# Patient Record
Sex: Female | Born: 1965 | Race: White | Hispanic: No | Marital: Married
Health system: Southern US, Community
[De-identification: ages and names within clinical notes are randomized; demographics above are authoritative.]

## PROBLEM LIST (undated history)

## (undated) DIAGNOSIS — N200 Calculus of kidney: Secondary | ICD-10-CM

## (undated) HISTORY — PX: SHOULDER SURGERY: SHX246

## (undated) HISTORY — PX: CHOLECYSTECTOMY: SHX55

---

## 2017-12-25 ENCOUNTER — Encounter (HOSPITAL_COMMUNITY): Payer: Self-pay | Admitting: Emergency Medicine

## 2017-12-25 ENCOUNTER — Emergency Department (HOSPITAL_COMMUNITY): Payer: Managed Care, Other (non HMO)

## 2017-12-25 ENCOUNTER — Other Ambulatory Visit: Payer: Self-pay

## 2017-12-25 ENCOUNTER — Emergency Department (HOSPITAL_COMMUNITY)
Admission: EM | Admit: 2017-12-25 | Discharge: 2017-12-25 | Disposition: A | Discharge status: Home / Self Care | Payer: Managed Care, Other (non HMO) | Attending: Emergency Medicine | Admitting: Emergency Medicine

## 2017-12-25 DIAGNOSIS — R0602 Shortness of breath: Secondary | ICD-10-CM | POA: Diagnosis not present

## 2017-12-25 DIAGNOSIS — E860 Dehydration: Secondary | ICD-10-CM | POA: Diagnosis not present

## 2017-12-25 DIAGNOSIS — I951 Orthostatic hypotension: Secondary | ICD-10-CM

## 2017-12-25 DIAGNOSIS — R42 Dizziness and giddiness: Secondary | ICD-10-CM | POA: Diagnosis present

## 2017-12-25 HISTORY — DX: Calculus of kidney: N20.0

## 2017-12-25 LAB — CBC WITH DIFFERENTIAL/PLATELET
BASOS ABS: 0 10*3/uL (ref 0.0–0.1)
BASOS PCT: 0 %
EOS PCT: 2 %
Eosinophils Absolute: 0.2 10*3/uL (ref 0.0–0.7)
HCT: 34.7 % — ABNORMAL LOW (ref 36.0–46.0)
Hemoglobin: 10.9 g/dL — ABNORMAL LOW (ref 12.0–15.0)
Lymphocytes Relative: 31 %
Lymphs Abs: 2.6 10*3/uL (ref 0.7–4.0)
MCH: 25.1 pg — ABNORMAL LOW (ref 26.0–34.0)
MCHC: 31.4 g/dL (ref 30.0–36.0)
MCV: 79.8 fL (ref 78.0–100.0)
MONO ABS: 0.4 10*3/uL (ref 0.1–1.0)
Monocytes Relative: 5 %
Neutro Abs: 5.1 10*3/uL (ref 1.7–7.7)
Neutrophils Relative %: 62 %
Platelets: 264 10*3/uL (ref 150–400)
RBC: 4.35 MIL/uL (ref 3.87–5.11)
RDW: 15 % (ref 11.5–15.5)
WBC: 8.3 10*3/uL (ref 4.0–10.5)

## 2017-12-25 LAB — URINALYSIS, ROUTINE W REFLEX MICROSCOPIC
Bilirubin Urine: NEGATIVE
Glucose, UA: NEGATIVE mg/dL
Hgb urine dipstick: NEGATIVE
KETONES UR: NEGATIVE mg/dL
LEUKOCYTES UA: NEGATIVE
NITRITE: NEGATIVE
Protein, ur: NEGATIVE mg/dL
SPECIFIC GRAVITY, URINE: 1.01 (ref 1.005–1.030)
pH: 7 (ref 5.0–8.0)

## 2017-12-25 LAB — BASIC METABOLIC PANEL
Anion gap: 7 (ref 5–15)
BUN: 12 mg/dL (ref 6–20)
CHLORIDE: 104 mmol/L (ref 101–111)
CO2: 27 mmol/L (ref 22–32)
CREATININE: 0.82 mg/dL (ref 0.44–1.00)
Calcium: 8.9 mg/dL (ref 8.9–10.3)
GFR calc Af Amer: >60 mL/min (ref >60)
GFR calc non Af Amer: >60 mL/min (ref >60)
Glucose, Bld: 115 mg/dL — ABNORMAL HIGH (ref 65–99)
POTASSIUM: 3.4 mmol/L — AB (ref 3.5–5.1)
SODIUM: 138 mmol/L (ref 135–145)

## 2017-12-25 LAB — I-STAT TROPONIN, ED: TROPONIN I, POC: 0 ng/mL (ref 0.00–0.08)

## 2017-12-25 LAB — PREGNANCY, URINE: PREG TEST UR: NEGATIVE

## 2017-12-25 LAB — BRAIN NATRIURETIC PEPTIDE: B Natriuretic Peptide: 21.1 pg/mL (ref 0.0–100.0)

## 2017-12-25 LAB — D-DIMER, QUANTITATIVE (NOT AT ARMC)

## 2017-12-25 MED ORDER — SODIUM CHLORIDE 0.9 % IV BOLUS
1000.0000 mL | Freq: Once | INTRAVENOUS | Status: AC
  Administered 2017-12-25: 1000 mL via INTRAVENOUS

## 2017-12-25 NOTE — ED Notes (Signed)
Patient transported to CT 

## 2017-12-25 NOTE — ED Notes (Signed)
During orthostatic vitals, pt stated that she felt a little lightheaded (sitting and standing). While laying flat, pt stated that she" felt pressure on the top of her head". Informed Dr. Particia Nearing.

## 2017-12-25 NOTE — ED Provider Notes (Signed)
[facility] EMERGENCY DEPARTMENT Provider Note   CSN: [id number] Arrival date & time:        History   Chief Complaint Chief Complaint  Patient presents with  . Dizziness    HPI Brittany Luna is a 52 y.o. female.  Pt presents to the ED today with sob and feeling lightheaded.  The pt said she was driving, coughed and felt lightheaded.  It went away, then sx came back several times.  The pt said she felt like she was going to pass out and pulled over.  She has been outside working in the heat for the last few days.  She denies any pain, n/v/d or f/c.     Past Medical History:  Diagnosis Date  . Kidney stones     There are no active problems to display for this patient.   Past Surgical History:  Procedure Laterality Date  . CHOLECYSTECTOMY    . SHOULDER SURGERY       OB History   None      Home Medications    Prior to Admission medications   Not on File    Family History No family history on file.  Social History Social History   Tobacco Use  . Smoking status: Not on file  Substance Use Topics  . Alcohol use: Not on file  . Drug use: Never     Allergies   Patient has no allergy information on record.   Review of Systems Review of Systems  Respiratory: Positive for shortness of breath.   Neurological: Positive for syncope, weakness and light-headedness.  All other systems reviewed and are negative.    Physical Exam Updated Vital Signs BP (!) 144/93   Pulse 94   Temp 98.7 F (37.1 C) (Oral)   Resp 16   SpO2 97%   Physical Exam  Constitutional: She is oriented to person, place, and time. She appears well-developed and well-nourished.  HENT:  Head: Normocephalic and atraumatic.  Right Ear: External ear normal.  Left Ear: External ear normal.  Nose: Nose normal.  Mouth/Throat: Oropharynx is clear and moist.  Eyes: Pupils are equal, round, and reactive to light. Conjunctivae and EOM are normal.  Neck: Normal  range of motion. Neck supple.  Cardiovascular: Normal rate, regular rhythm, normal heart sounds and intact distal pulses.  Pulmonary/Chest: Effort normal and breath sounds normal.  Abdominal: Soft. Bowel sounds are normal.  Musculoskeletal: Normal range of motion.  Neurological: She is alert and oriented to person, place, and time.  Skin: Skin is warm and dry. Capillary refill takes less than 2 seconds.  Psychiatric: She has a normal mood and affect. Her behavior is normal. Judgment and thought content normal.  Nursing note and vitals reviewed.    ED Treatments / Results  Labs (all labs ordered are listed, but only abnormal results are displayed) Labs Reviewed  BASIC METABOLIC PANEL - Abnormal; Notable for the following components:      Result Value   Potassium 3.4 (*)    Glucose, Bld 115 (*)    All other components within normal limits  CBC WITH DIFFERENTIAL/PLATELET - Abnormal; Notable for the following components:   Hemoglobin 10.9 (*)    HCT 34.7 (*)    MCH 25.1 (*)    All other components within normal limits  BRAIN NATRIURETIC PEPTIDE  URINALYSIS, ROUTINE W REFLEX MICROSCOPIC  PREGNANCY, URINE  D-DIMER, QUANTITATIVE (NOT AT [facility])  CBC WITH DIFFERENTIAL/PLATELET  I-STAT TROPONIN, ED    EKG  EKG Interpretation  Date/Time:  Sunday Dec 25 2017 12:59:15 EDT Ventricular Rate:  84 PR Interval:    QRS Duration: 106 QT Interval:  367 QTC Calculation: 434 R Axis:   -118 Text Interpretation:  Sinus rhythm Right superior axis Low voltage, precordial leads Borderline T abnormalities, anterior leads No old tracing to compare Confirmed by Jacalyn Lefevre [phone]) on 12/25/2017 2:21:12 PM   Radiology Dg Chest 2 View  Result Date: 12/25/2017 CLINICAL DATA:  Cough EXAM: CHEST - 2 VIEW COMPARISON:  None. FINDINGS: The heart size and mediastinal contours are within normal limits. Both lungs are clear. The visualized skeletal structures are unremarkable. IMPRESSION: No active  cardiopulmonary disease. Electronically Signed   By: Sherian Rein M.D.   On: 12/25/2017 11:55   Ct Head Wo Contrast  Result Date: 12/25/2017 CLINICAL DATA:  Head pressure and dizziness after coughing EXAM: CT HEAD WITHOUT CONTRAST TECHNIQUE: Contiguous axial images were obtained from the base of the skull through the vertex without intravenous contrast. COMPARISON:  None. FINDINGS: Brain: No evidence of acute infarction, hemorrhage, hydrocephalus, extra-axial collection or mass lesion/mass effect. Vascular: No hyperdense vessel or unexpected calcification. Skull: Intact. Sinuses/Orbits: Partial visualization of a mucous retention cyst or polyp right maxillary sinus. Otherwise negative. Other: None. IMPRESSION: No acute abnormality finding to explain the patient's symptoms. Partial visualization a mucous retention cyst or polyp in the right maxillary sinus. Electronically Signed   By: Drusilla Kanner M.D.   On: 12/25/2017 12:41    Procedures Procedures (including critical care time)  Medications Ordered in ED Medications  sodium chloride 0.9 % bolus 1,000 mL (1,000 mLs Intravenous New Bag/Given 12/25/17 1325)     Initial Impression / Assessment and Plan / ED Course  I have reviewed the triage vital signs and the nursing notes.  Pertinent labs & imaging results that were available during my care of the patient were reviewed by me and considered in my medical decision making (see chart for details).     Pt is feeling much better after IVFs.  She is encouraged to drink lots of fluids and return if worse.  Final Clinical Impressions(s) / ED Diagnoses   Final diagnoses:  Dehydration  Orthostatic hypotension    ED Discharge Orders    None       Jacalyn Lefevre, MD 12/25/17 1540

## 2017-12-25 NOTE — ED Triage Notes (Addendum)
Pt to ED via [facility] from home after having multiple episodes of "head pressure and dizziness" after coughing- denies forceful coughing - has been treated for bronchitis a month ago. No resp distress, no weakness, equal grips,

## 2019-10-16 IMAGING — DX DG CHEST 2V
2 series · 2 of 2 positions shown · non-contrast
Comparison: None.

CLINICAL DATA: Cough

EXAM:
CHEST - 2 VIEW

[w chest pa]
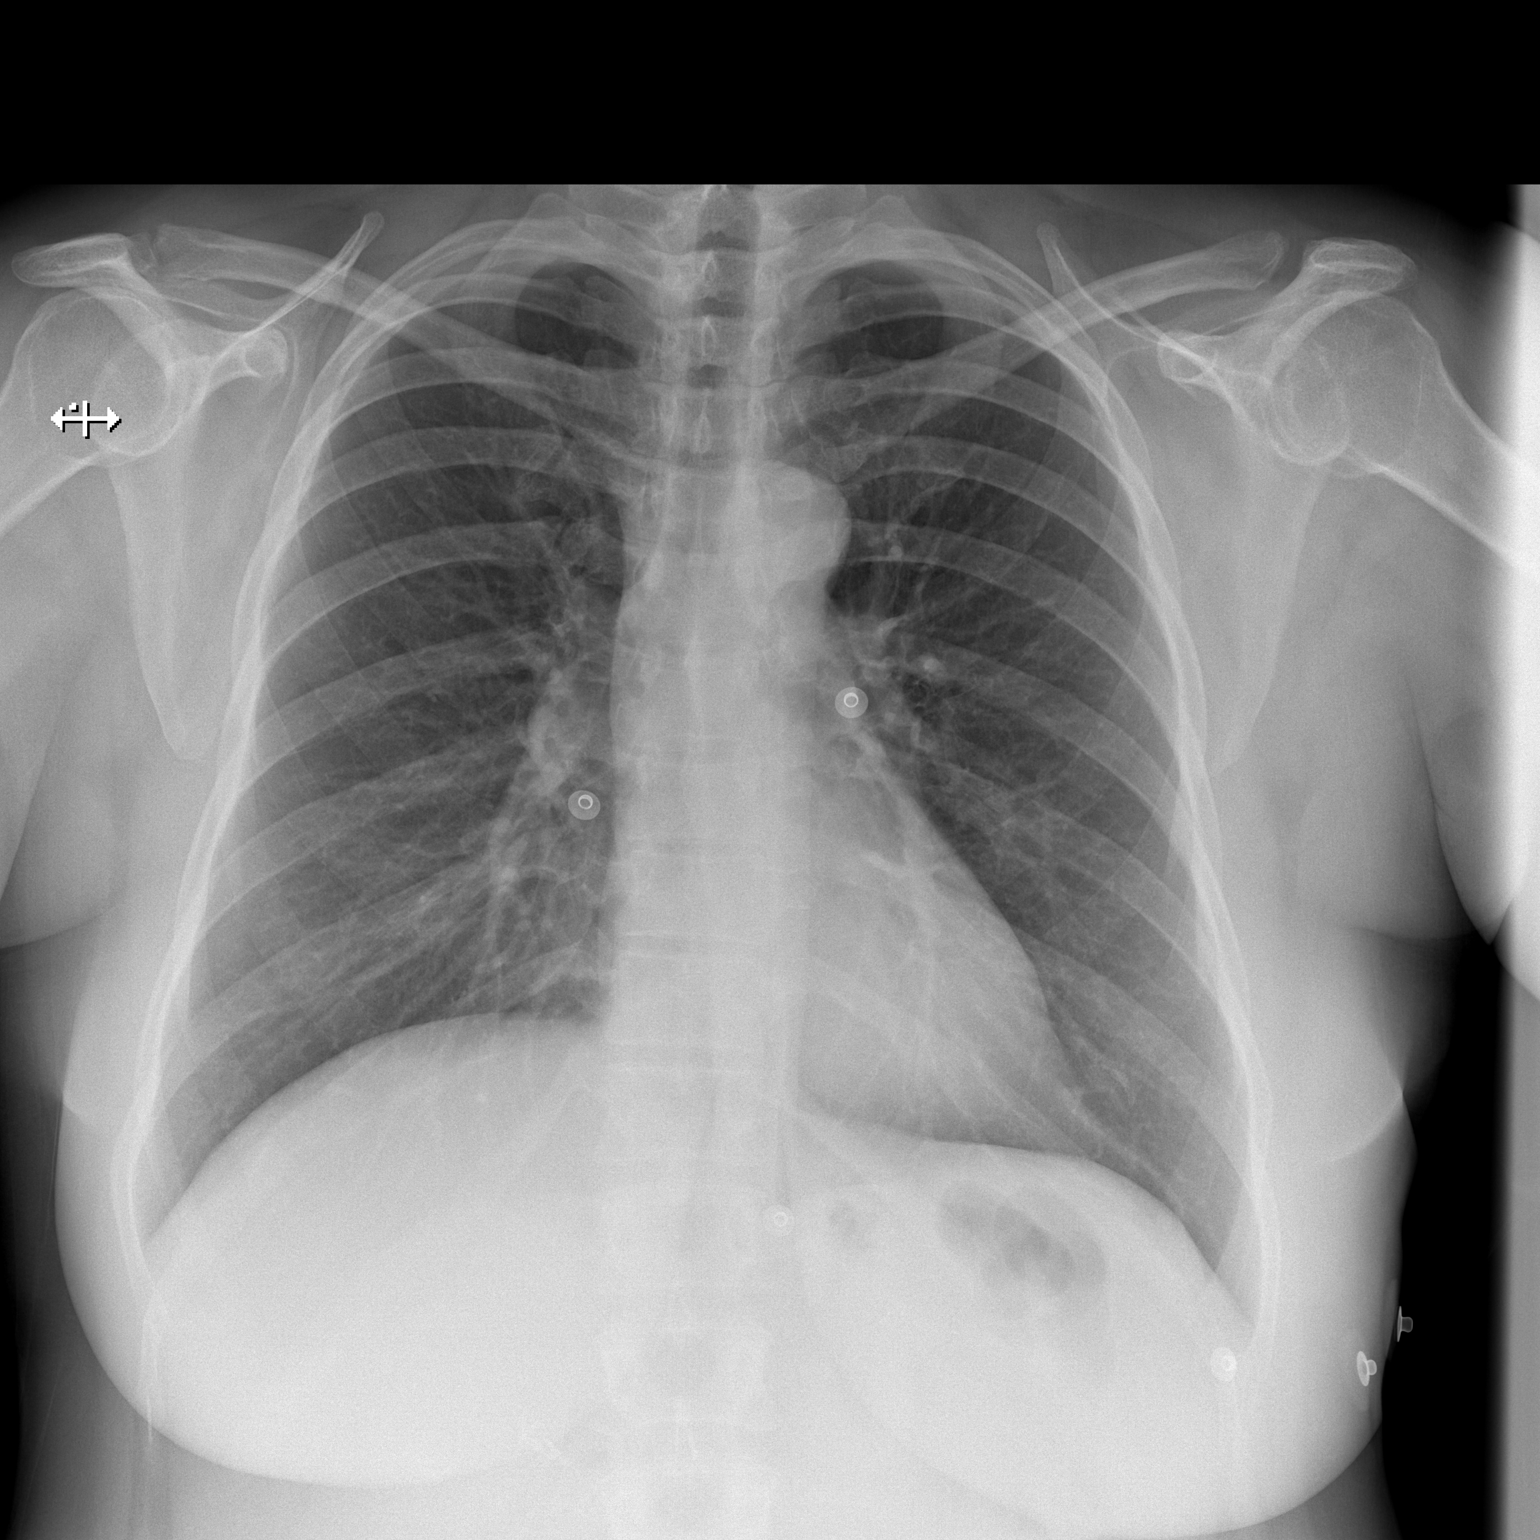

[w chest lat]
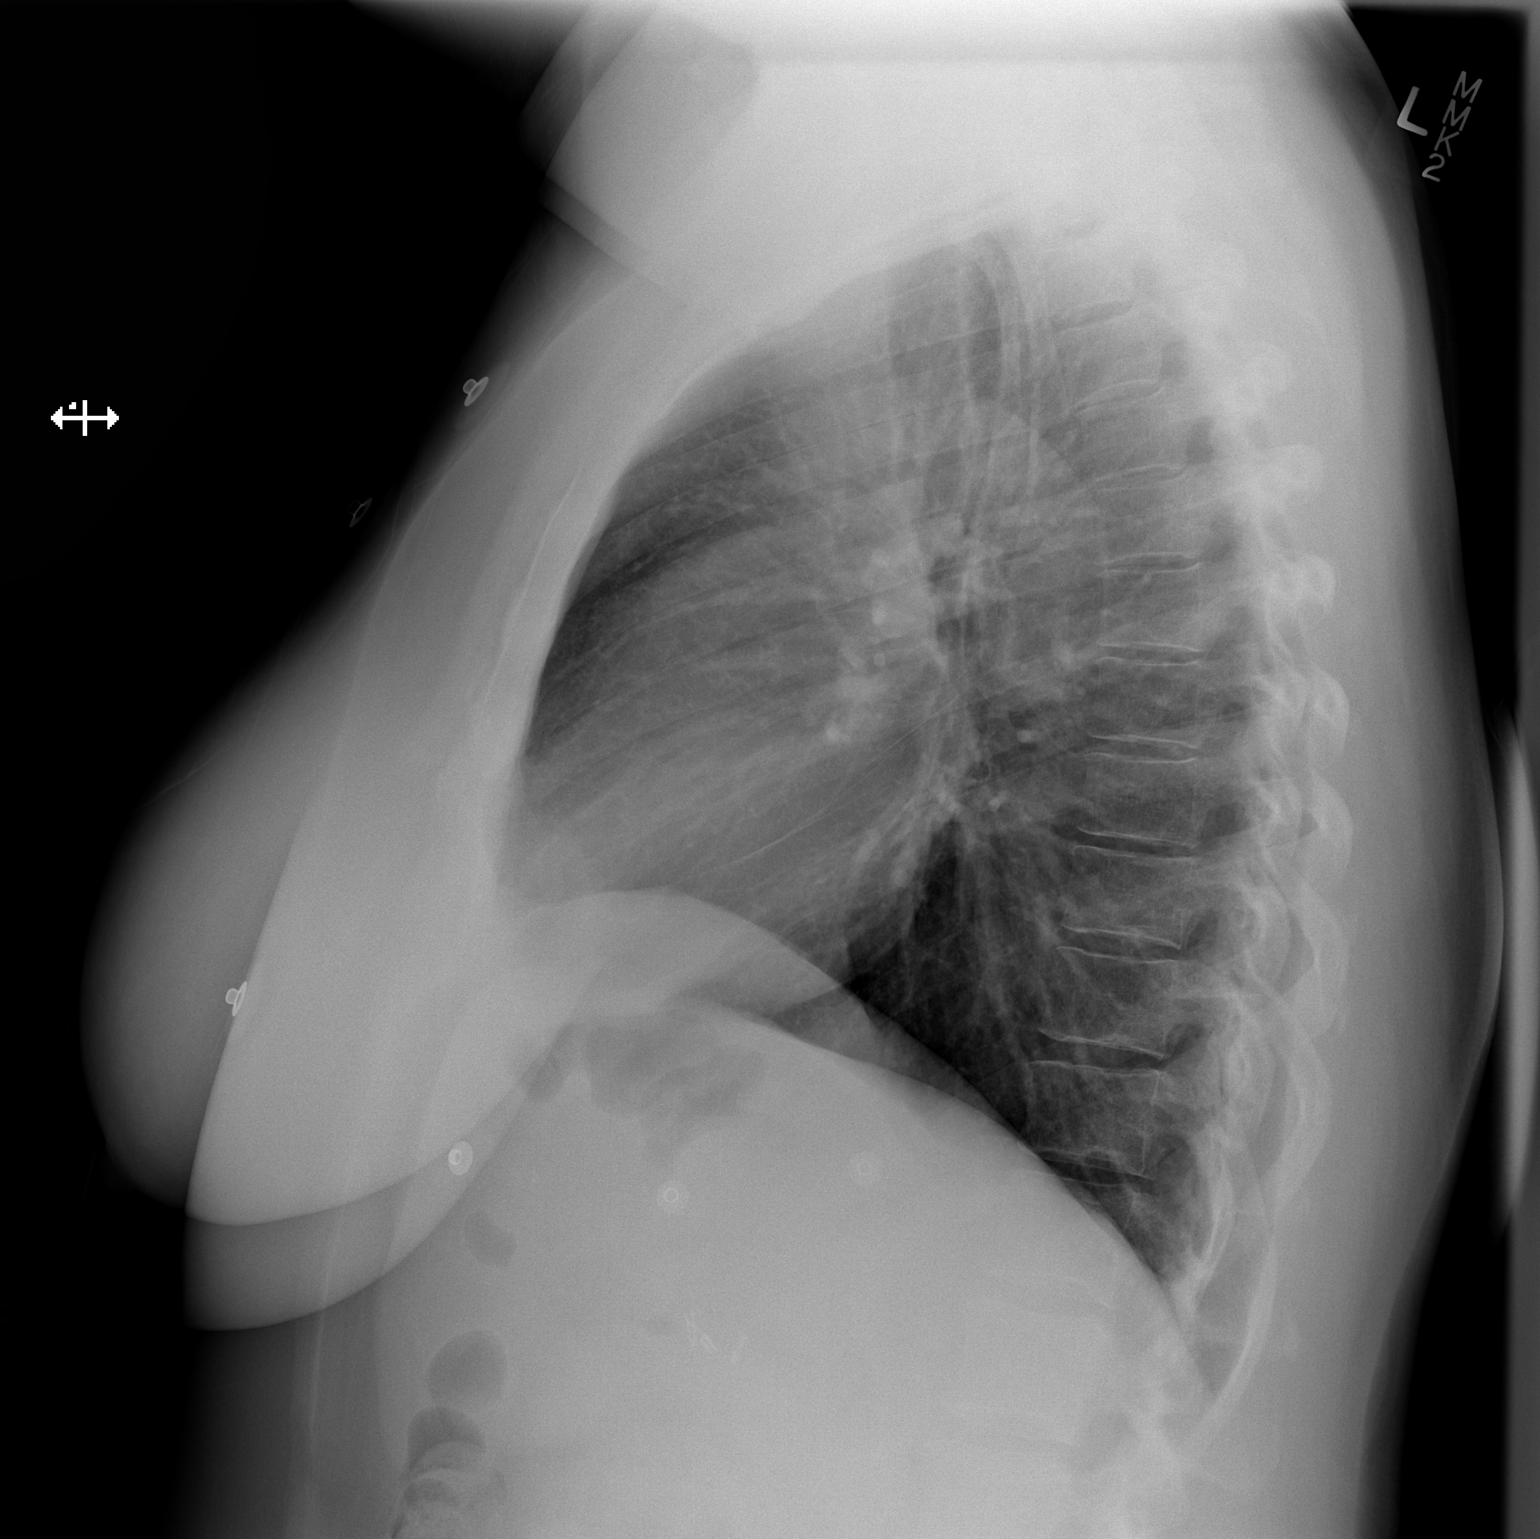

[2 of 2 positions shown; findings below may reference images not displayed]

FINDINGS: The heart size and mediastinal contours are within normal limits.
Both lungs are clear. The visualized skeletal structures are
unremarkable.
IMPRESSION: No active cardiopulmonary disease.

## 2019-10-16 IMAGING — CT CT HEAD W/O CM
4 series · 16 of 47 positions shown, 18 images · non-contrast
Comparison: None.

CLINICAL DATA: Head pressure and dizziness after coughing

EXAM:
CT HEAD WITHOUT CONTRAST
TECHNIQUE: Contiguous axial images were obtained from the base of the skull
through the vertex without intravenous contrast.

[Series 3: head wo · axial · 0.48mm/px · z∈[-110,+10]mm · 7 of 32 slices shown, 9 images]
[im 4/32  brain]
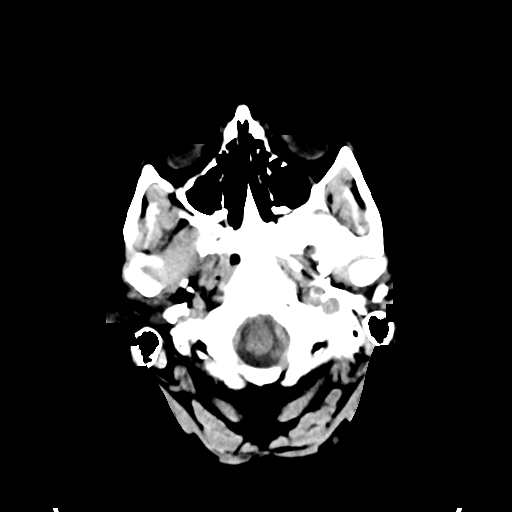
[im 4/32  bone]
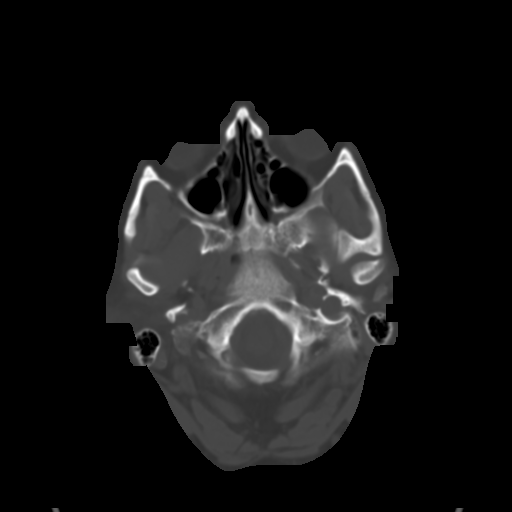
[im 8/32  brain]
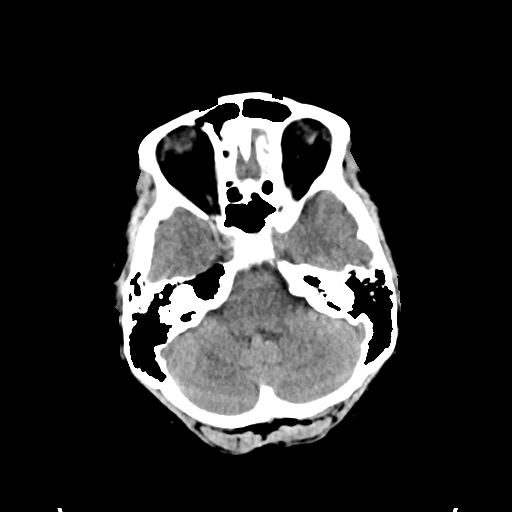
[im 12/32  brain]
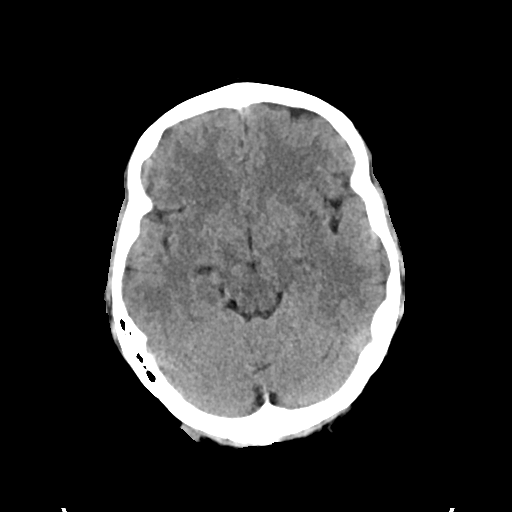
[im 16/32  brain]
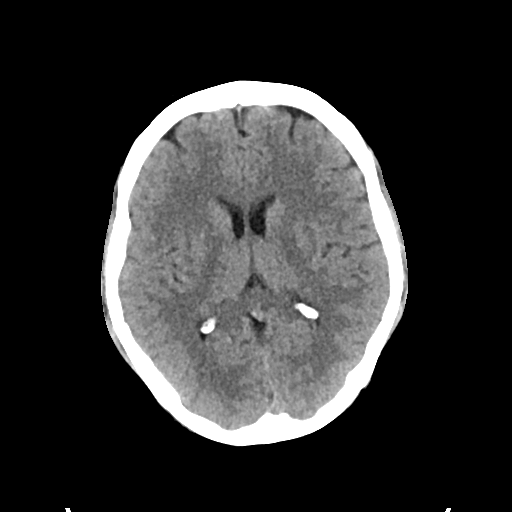
[im 20/32  brain]
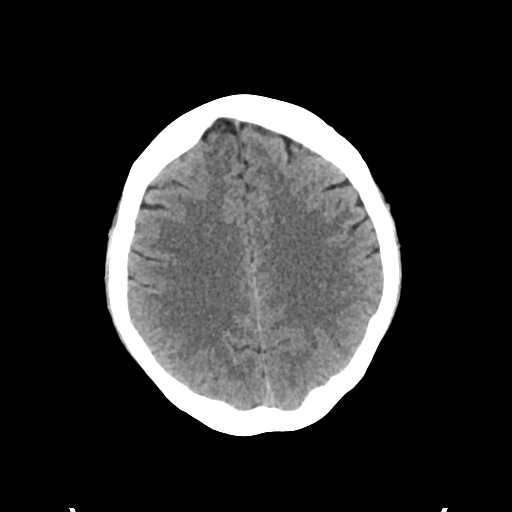
[im 20/32  bone]
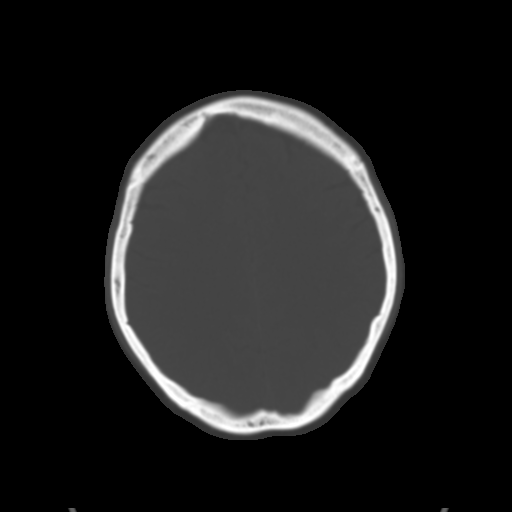
[im 24/32  brain]
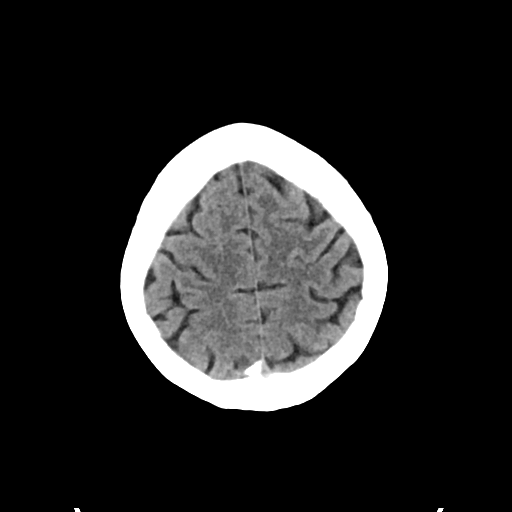
[im 28/32  brain]
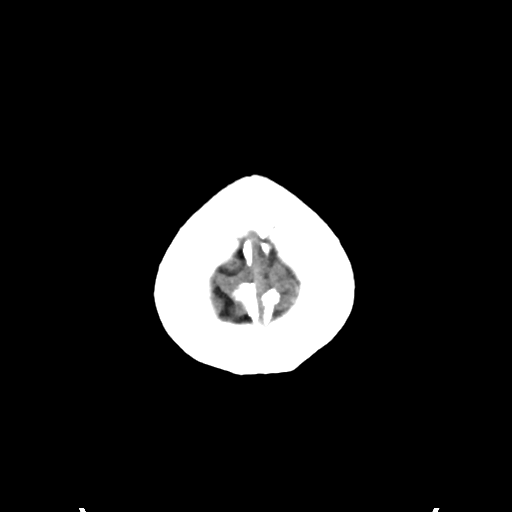

[Series 4: head bone · axial · 0.48mm/px · z∈[-110,-78]mm · 3 of 79 slices shown]
[im 8/79  bone]
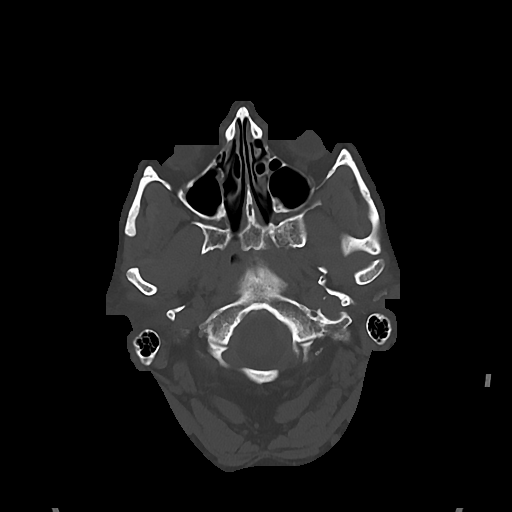
[im 16/79  bone]
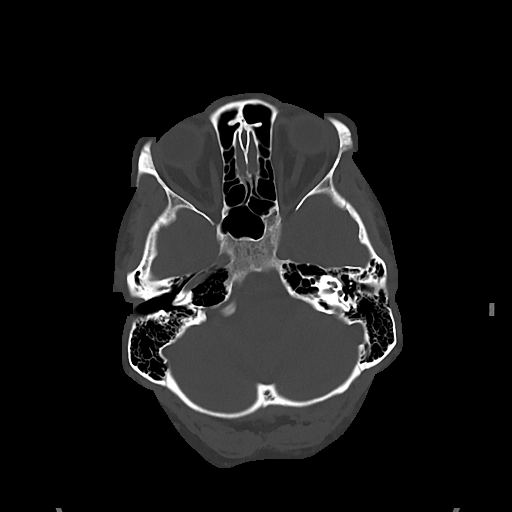
[im 24/79  bone]
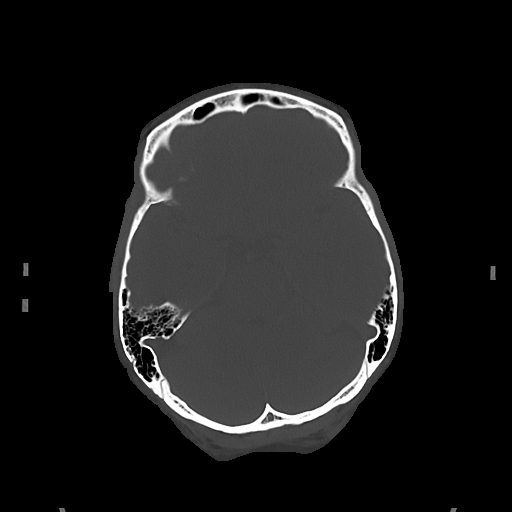

[Series 5: cor soft · coronal · 0.31mm/px · 3 of 69 slices shown]
[im 23/69  brain]
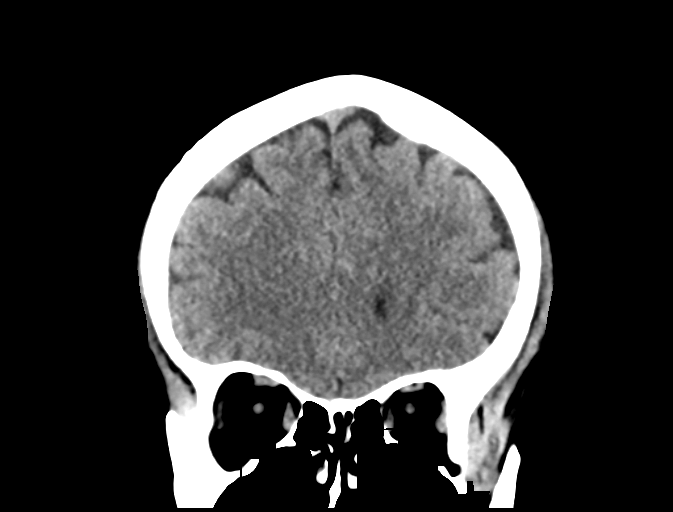
[im 31/69  brain]
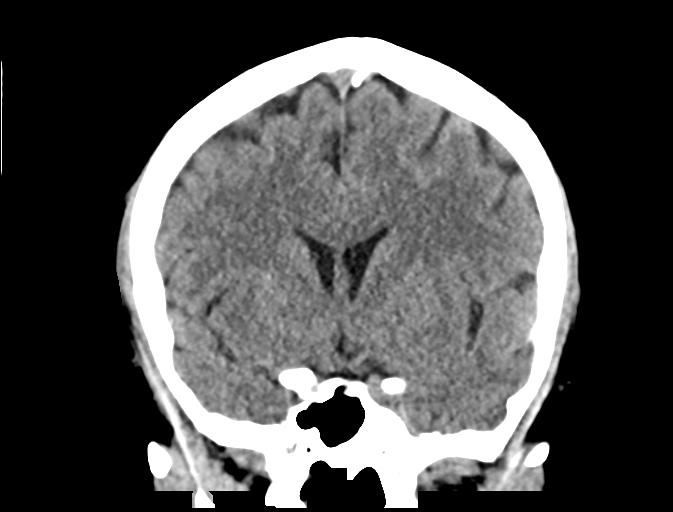
[im 38/69  brain]
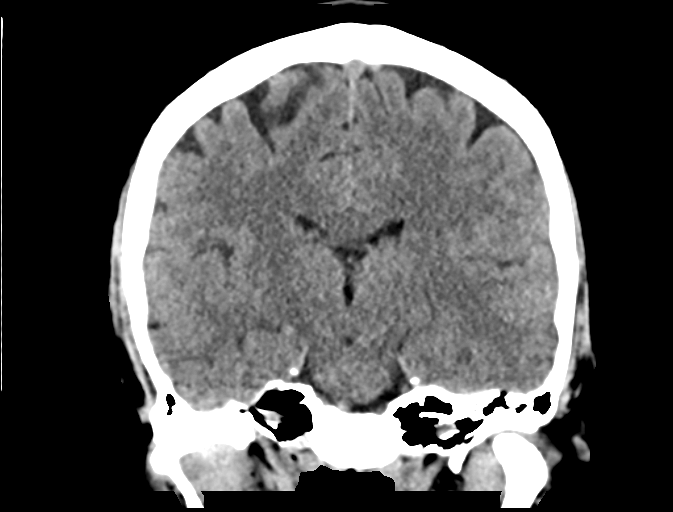

[Series 6: sag soft · sagittal · 0.35mm/px · 3 of 65 slices shown]
[im 22/65  brain]
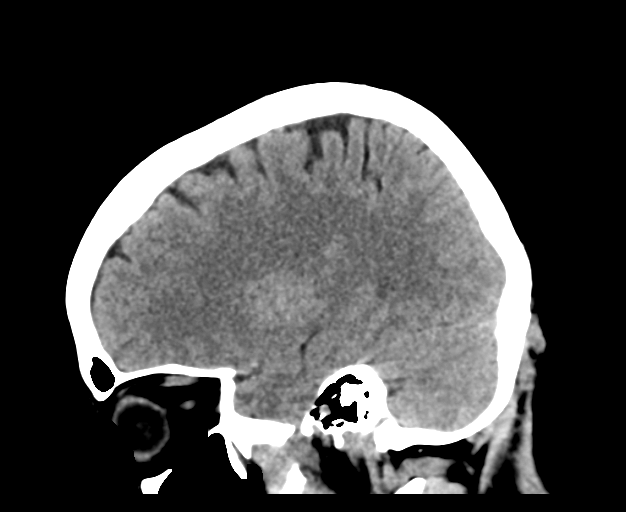
[im 33/65  brain]
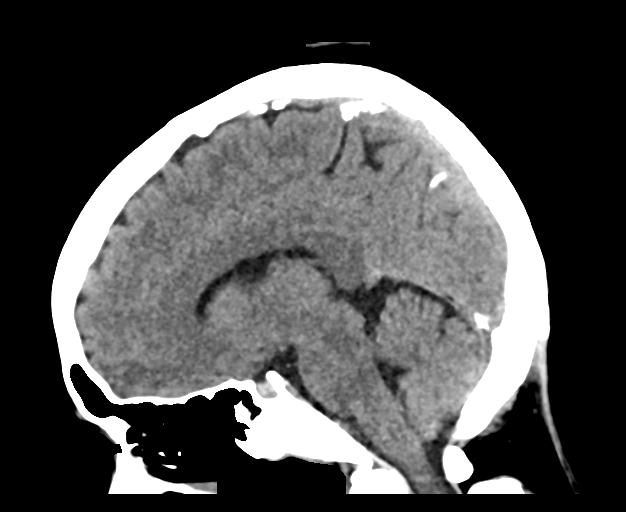
[im 43/65  brain]
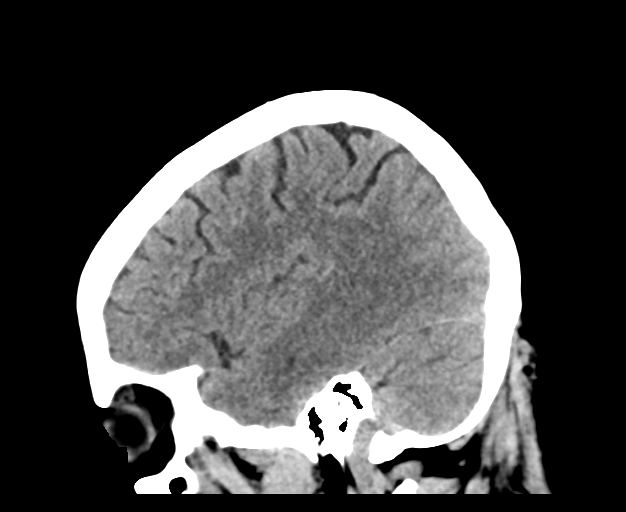

[16 of 47 positions shown; findings below may reference images not displayed]

FINDINGS: Brain: No evidence of acute infarction, hemorrhage, hydrocephalus,
extra-axial collection or mass lesion/mass effect.

Vascular: No hyperdense vessel or unexpected calcification.

Skull: Intact.

Sinuses/Orbits: Partial visualization of a mucous retention cyst or
polyp right maxillary sinus. Otherwise negative.

Other: None.
IMPRESSION: No acute abnormality finding to explain the patient's symptoms.

Partial visualization a mucous retention cyst or polyp in the right
maxillary sinus.

## 2022-06-04 ENCOUNTER — Emergency Department (HOSPITAL_BASED_OUTPATIENT_CLINIC_OR_DEPARTMENT_OTHER): Payer: Managed Care, Other (non HMO)

## 2022-06-04 ENCOUNTER — Emergency Department (HOSPITAL_BASED_OUTPATIENT_CLINIC_OR_DEPARTMENT_OTHER)
Admission: EM | Admit: 2022-06-04 | Discharge: 2022-06-04 | Disposition: A | Discharge status: Home / Self Care | Payer: Managed Care, Other (non HMO) | Attending: Emergency Medicine | Admitting: Emergency Medicine

## 2022-06-04 ENCOUNTER — Other Ambulatory Visit: Payer: Self-pay

## 2022-06-04 ENCOUNTER — Other Ambulatory Visit (HOSPITAL_BASED_OUTPATIENT_CLINIC_OR_DEPARTMENT_OTHER): Payer: Self-pay

## 2022-06-04 DIAGNOSIS — R109 Unspecified abdominal pain: Secondary | ICD-10-CM | POA: Diagnosis present

## 2022-06-04 DIAGNOSIS — N23 Unspecified renal colic: Secondary | ICD-10-CM

## 2022-06-04 DIAGNOSIS — N132 Hydronephrosis with renal and ureteral calculous obstruction: Secondary | ICD-10-CM | POA: Diagnosis not present

## 2022-06-04 LAB — BASIC METABOLIC PANEL
Anion gap: 11 (ref 5–15)
BUN: 21 mg/dL — ABNORMAL HIGH (ref 6–20)
CO2: 24 mmol/L (ref 22–32)
Calcium: 9.4 mg/dL (ref 8.9–10.3)
Chloride: 107 mmol/L (ref 98–111)
Creatinine, Ser: 1.12 mg/dL — ABNORMAL HIGH (ref 0.44–1.00)
GFR, Estimated: 58 mL/min — ABNORMAL LOW (ref >60)
Glucose, Bld: 135 mg/dL — ABNORMAL HIGH (ref 70–99)
Potassium: 3.9 mmol/L (ref 3.5–5.1)
Sodium: 142 mmol/L (ref 135–145)

## 2022-06-04 LAB — CBC
HCT: 42.3 % (ref 36.0–46.0)
Hemoglobin: 13.9 g/dL (ref 12.0–15.0)
MCH: 29 pg (ref 26.0–34.0)
MCHC: 32.9 g/dL (ref 30.0–36.0)
MCV: 88.3 fL (ref 80.0–100.0)
Platelets: 223 10*3/uL (ref 150–400)
RBC: 4.79 MIL/uL (ref 3.87–5.11)
RDW: 13 % (ref 11.5–15.5)
WBC: 11.7 10*3/uL — ABNORMAL HIGH (ref 4.0–10.5)
nRBC: 0 % (ref 0.0–0.2)

## 2022-06-04 LAB — URINALYSIS, ROUTINE W REFLEX MICROSCOPIC
Bilirubin Urine: NEGATIVE
Glucose, UA: NEGATIVE mg/dL
Ketones, ur: NEGATIVE mg/dL
Nitrite: NEGATIVE
Specific Gravity, Urine: 1.022 (ref 1.005–1.030)
pH: 5.5 (ref 5.0–8.0)

## 2022-06-04 LAB — HCG, QUANTITATIVE, PREGNANCY: hCG, Beta Chain, Quant, S: 2 m[IU]/mL (ref <5)

## 2022-06-04 LAB — PREGNANCY, URINE: Preg Test, Ur: NEGATIVE

## 2022-06-04 MED ORDER — PROMETHAZINE HCL 25 MG/ML IJ SOLN
INTRAMUSCULAR | Status: AC
  Filled 2022-06-04: qty 1

## 2022-06-04 MED ORDER — KETOROLAC TROMETHAMINE 30 MG/ML IJ SOLN
10.0000 mg | Freq: Once | INTRAMUSCULAR | Status: AC | PRN
  Administered 2022-06-04: 9.9 mg via INTRAVENOUS
  Filled 2022-06-04: qty 1

## 2022-06-04 MED ORDER — KETOROLAC TROMETHAMINE 10 MG PO TABS
10.0000 mg | ORAL_TABLET | Freq: Four times a day (QID) | ORAL | 0 refills | Status: AC | PRN
  Filled 2022-06-04: qty 20, 5d supply, fill #0

## 2022-06-04 MED ORDER — ACETAMINOPHEN 500 MG PO TABS
500.0000 mg | ORAL_TABLET | Freq: Four times a day (QID) | ORAL | 0 refills | Status: AC | PRN
  Filled 2022-06-04: qty 30, 8d supply, fill #0

## 2022-06-04 MED ORDER — ONDANSETRON 8 MG PO TBDP
8.0000 mg | ORAL_TABLET | Freq: Three times a day (TID) | ORAL | 0 refills | Status: AC | PRN
  Filled 2022-06-04: qty 20, 7d supply, fill #0

## 2022-06-04 MED ORDER — SODIUM CHLORIDE 0.9 % IV SOLN
25.0000 mg | Freq: Four times a day (QID) | INTRAVENOUS | Status: DC | PRN
  Filled 2022-06-04: qty 1

## 2022-06-04 MED ORDER — ONDANSETRON HCL 4 MG/2ML IJ SOLN
4.0000 mg | Freq: Once | INTRAMUSCULAR | Status: AC
  Administered 2022-06-04: 4 mg via INTRAVENOUS
  Filled 2022-06-04: qty 2

## 2022-06-04 MED ORDER — KETOROLAC TROMETHAMINE 15 MG/ML IJ SOLN
15.0000 mg | Freq: Once | INTRAMUSCULAR | Status: AC
  Administered 2022-06-04: 15 mg via INTRAVENOUS
  Filled 2022-06-04: qty 1

## 2022-06-04 NOTE — ED Provider Notes (Signed)
[facility] EMERGENCY DEPT Provider Note   CSN: [id number] Arrival date & time: 06/04/22  0747     History  Chief Complaint  Patient presents with   Flank Pain    Brittany Luna is a 56 y.o. female.  HPI    56 year old female comes in with chief complaint of right flank pain for the last 2 days.  Pain became intense at 2 AM.  Patient has history of multiple kidney stones, she has required 3-4 surgical intervention for her kidney stone, last one was 2 years ago in [place].  Patient states that for the last 2 days the pain was manageable, however overnight the pain became unbearable.  She has been taking Zofran, NSAIDs at home without any relief.  Denies any blood in the urine, patient has history of cholecystectomy.  Home Medications Prior to Admission medications   Medication Sig Start Date End Date Taking? Authorizing Provider  acetaminophen (TYLENOL) 500 MG tablet Take 1 tablet (500 mg total) by mouth every 6 (six) hours as needed. 06/04/22  Yes Derwood Kaplan, MD  ketorolac (TORADOL) 10 MG tablet Take 1 tablet (10 mg total) by mouth every 6 (six) hours as needed. 06/04/22  Yes Aeron Donaghey, MD  ondansetron (ZOFRAN-ODT) 8 MG disintegrating tablet Take 1 tablet (8 mg total) by mouth every 8 (eight) hours as needed for nausea. 06/04/22  Yes Derwood Kaplan, MD      Allergies    Morphine and related and Penicillins    Review of Systems   Review of Systems  Physical Exam Updated Vital Signs BP (!) 142/88   Pulse 77   Temp 99 F (37.2 C) (Oral)   Resp 16   Ht 5\' 6"  (1.676 m)   Wt 93 kg   SpO2 100%   BMI 33.09 kg/m  Physical Exam Vitals and nursing note reviewed.  Constitutional:      General: She is in acute distress.     Appearance: She is well-developed. She is not toxic-appearing.  HENT:     Head: Atraumatic.  Cardiovascular:     Rate and Rhythm: Normal rate.  Pulmonary:     Effort: Pulmonary effort is normal.  Abdominal:      Tenderness: There is abdominal tenderness.     Comments: Right flank tenderness only  Musculoskeletal:     Cervical back: Normal range of motion and neck supple.  Skin:    General: Skin is warm and dry.  Neurological:     Mental Status: She is alert and oriented to person, place, and time.     ED Results / Procedures / Treatments   Labs (all labs ordered are listed, but only abnormal results are displayed) Labs Reviewed  URINALYSIS, ROUTINE W REFLEX MICROSCOPIC - Abnormal; Notable for the following components:      Result Value   Hgb urine dipstick MODERATE (*)    Protein, ur TRACE (*)    Leukocytes,Ua TRACE (*)    Bacteria, UA RARE (*)    All other components within normal limits  BASIC METABOLIC PANEL - Abnormal; Notable for the following components:   Glucose, Bld 135 (*)    BUN 21 (*)    Creatinine, Ser 1.12 (*)    GFR, Estimated 58 (*)    All other components within normal limits  CBC - Abnormal; Notable for the following components:   WBC 11.7 (*)    All other components within normal limits  PREGNANCY, URINE  HCG, QUANTITATIVE, PREGNANCY    EKG  None  Radiology No results found.  Procedures Procedures    Medications Ordered in ED Medications  promethazine (PHENERGAN) 25 mg in sodium chloride 0.9 % 50 mL IVPB (has no administration in time range)  promethazine (PHENERGAN) 25 MG/ML injection (has no administration in time range)  ketorolac (TORADOL) 15 MG/ML injection 15 mg (15 mg Intravenous Given 06/04/22 0811)  ketorolac (TORADOL) 30 MG/ML injection 9.9 mg (9.9 mg Intravenous Given 06/04/22 1208)  ondansetron (ZOFRAN) injection 4 mg (4 mg Intravenous Given 06/04/22 1208)    ED Course/ Medical Decision Making/ A&P Clinical Course as of 06/04/22 1219  Fri Jun 04, 2022  1217 Patient reassessed at 11 AM and again at noon.  She is feeling better.  There was a delay in obtaining CT results.  CT scan independently interpreted as well.  There is clear  evidence of hydronephrosis on right side.  Pain is relatively in well controlled.  Patient has not required any further medication after the initial IV Toradol.  She indicates that she has in the past responded to oral Toradol, narcotic does not help.  Therefore we have prescribed oral Toradol.  I have shared the results of the CT scan and given her a copy of it, since she is from [place] and her urologist resides over there.  Strict ER return precautions have been discussed.  Patient is stable for discharge. [AN]    Clinical Course User Index [AN] Varney Biles, MD                           Medical Decision Making Problems Addressed: Ureteral colic: acute illness or injury with systemic symptoms  Amount and/or Complexity of Data Reviewed Labs: ordered. Radiology: ordered.  Risk OTC drugs. Prescription drug management.   This patient presents to the ED with chief complaint(s) of flank pain over the right side for 2 days, becoming more severe at 2 AM with pertinent past medical history of cholecystectomy, multiple kidney stones with at least 2 or 3 occasions that required surgical intervention.The complaint involves an extensive differential diagnosis and also carries with it a high risk of complications and morbidity.    The differential diagnosis includes : Kidney stone, choledocholithiasis, pyelonephritis, perforated viscus.  The initial plan is to get basic labs and attempt to get patient's symptoms in better control.  IV Toradol ordered along with IV promethazine as patient has already taken Zofran at home.  Care everywhere reviewed.  Patient did require lithotripsy and stent placement in 2021 while at [facility].  Patient states that is because of her history of recurrent stone, but she only comes in when the pain is unbearable and often at this stage she requires intervention by urology.  We will proceed with CT renal stone.   Additional history obtained: Records  reviewed Care Everywhere/External Records.   Independent labs interpretation:  The following labs were independently interpreted: CBC, BMP are reassuring.  Independent visualization and interpretation of imaging: - I independently visualized the following imaging with scope of interpretation limited to determining acute life threatening conditions related to emergency care: CT renal stone, which revealed right-sided hydronephrosis. Radiologist interpretation reveals that patient has 2 stones, 1 8 mm stone that has just passed the renal pelvis and then another 7 mm stone that is at the [facility].  Treatment and Reassessment: Patient reassessed on 2 separate occasions.  Pain has been controlled with initial round of Toradol.   Consideration for admission or further workup:  Patient's pain is not controlled.  She does not have septic stone.  Therefore that she does not need admission at this time.   Final Clinical Impression(s) / ED Diagnoses Final diagnoses:  Ureteral colic    Rx / DC Orders ED Discharge Orders          Ordered    ketorolac (TORADOL) 10 MG tablet  Every 6 hours PRN        06/04/22 1216    acetaminophen (TYLENOL) 500 MG tablet  Every 6 hours PRN        06/04/22 1216    ondansetron (ZOFRAN-ODT) 8 MG disintegrating tablet  Every 8 hours PRN        06/04/22 1216              Derwood Kaplan, MD 06/04/22 1219

## 2022-06-04 NOTE — ED Notes (Signed)
Patient transported to CT 

## 2022-06-04 NOTE — Discharge Instructions (Addendum)
We saw you in the ER for the abdominal pain. Our results indicate that you have a kidney stone. We were able to get your pain is relative control, and we can safely send you home.  Take the meds prescribed. Set up an appointment with the Urologist, and see them in 2 weeks if the pain is persistent.  He can cancel the appointment if you get better and pass the stone on your own. If the pain is unbearable, you start having fevers, chills, and are unable to keep any meds down - then return to the ER.

## 2022-06-04 NOTE — ED Triage Notes (Signed)
Via EMS form home c/o right flank pain x 2 days, much worse overnight. Hx renal stones with multiple stents placed. Pain 10/10, pt appears very uncomfortable on arrival. 20g IV in place; pt reports allergy to narcotics and refused meds en route. Self-administered 8mg  zofran ODT before calling 911 with no relief of nausea.

## 2022-06-04 NOTE — ED Notes (Signed)
Patient verbalizes understanding of discharge instructions. Opportunity for questioning and answers were provided. Patient discharged from ED.  °
